# Patient Record
Sex: Female | Born: 2000 | Race: Black or African American | Hispanic: No | Marital: Single | Smoking: Never smoker
Health system: Southern US, Community
[De-identification: ages and names within clinical notes are randomized; demographics above are authoritative.]

## PROBLEM LIST (undated history)

## (undated) DIAGNOSIS — N83209 Unspecified ovarian cyst, unspecified side: Secondary | ICD-10-CM

## (undated) HISTORY — PX: WISDOM TOOTH EXTRACTION: SHX21

## (undated) HISTORY — DX: Unspecified ovarian cyst, unspecified side: N83.209

---
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
## 2019-01-08 ENCOUNTER — Telehealth: Payer: Self-pay | Admitting: Women's Health

## 2019-01-08 NOTE — Telephone Encounter (Signed)
Pt states that she is needing an apt for issues she is having from her nexplanon. She states that she is bleeding, having headaches, and is wanting the nexplanon to be removed.

## 2019-01-16 ENCOUNTER — Telehealth: Payer: Self-pay | Admitting: Advanced Practice Midwife

## 2019-01-16 NOTE — Telephone Encounter (Signed)

## 2019-01-17 ENCOUNTER — Ambulatory Visit (INDEPENDENT_AMBULATORY_CARE_PROVIDER_SITE_OTHER): Payer: Medicaid Other | Admitting: Advanced Practice Midwife

## 2019-01-17 ENCOUNTER — Encounter: Payer: Self-pay | Admitting: Advanced Practice Midwife

## 2019-01-17 ENCOUNTER — Other Ambulatory Visit: Payer: Self-pay

## 2019-01-17 VITALS — BP 101/57 | HR 70 | Ht 64.0 in | Wt 155.0 lb

## 2019-01-17 DIAGNOSIS — Z3046 Encounter for surveillance of implantable subdermal contraceptive: Secondary | ICD-10-CM | POA: Insufficient documentation

## 2019-01-17 MED ORDER — NORETHIN-ETH ESTRAD-FE BIPHAS 1 MG-10 MCG / 10 MCG PO TABS: 1.0000 | ORAL_TABLET | Freq: Every day | ORAL | 11 refills | Status: DC

## 2019-01-17 NOTE — Progress Notes (Signed)
HPI:  Toni Johnston 18 y.o. here for Nexplanon removal. She has had it for 2.5 years and still bleeds every few months for a few months at a time. Has tried Megace.  Her future plans for birth control are COCs.  Past Medical History: No past medical history on file.  Past Surgical History: Past Surgical History:  Procedure Laterality Date  . WISDOM TOOTH EXTRACTION      Family History: Family History  Problem Relation Age of Onset  . Cancer Paternal Grandfather   . Hypertension Maternal Grandmother   . Seizures Maternal Grandmother     Social History: Social History   Tobacco Use  . Smoking status: Never Smoker  . Smokeless tobacco: Never Used  Substance Use Topics  . Alcohol use: No  . Drug use: No    Allergies: No Known Allergies  Meds: (Not in a hospital admission)     Patient given informed consent for removal of her Nexplanon, time out was performed.  Signed copy in the chart.  Appropriate time out taken. Implanon site identified.  Area prepped in usual sterile fashon. One cc of 1% lidocaine was used to anesthetize the area at the distal end of the implant. A small stab incision was made right beside the implant on the distal portion.  The Nexplanon rod was grasped using hemostats and removed without difficulty.  There was less than 3 cc blood loss. There were no complications.  A small amount of antibiotic ointment and steri-strips were applied over the small incision.  A pressure bandage was applied to reduce any bruising.  The patient tolerated the procedure well and was given post procedure instructions.

## 2019-01-17 NOTE — Patient Instructions (Signed)

## 2019-02-07 DIAGNOSIS — H5203 Hypermetropia, bilateral: Secondary | ICD-10-CM | POA: Diagnosis not present

## 2019-02-26 DIAGNOSIS — H5213 Myopia, bilateral: Secondary | ICD-10-CM | POA: Diagnosis not present

## 2019-03-08 DIAGNOSIS — H5203 Hypermetropia, bilateral: Secondary | ICD-10-CM | POA: Diagnosis not present

## 2019-03-08 DIAGNOSIS — H52223 Regular astigmatism, bilateral: Secondary | ICD-10-CM | POA: Diagnosis not present

## 2019-04-01 ENCOUNTER — Telehealth: Payer: Self-pay | Admitting: *Deleted

## 2019-04-01 NOTE — Telephone Encounter (Signed)
Patient states she is wanting to change birth control pills as she is having irregular bleeding with the current ones.

## 2019-04-01 NOTE — Telephone Encounter (Addendum)
Pt is on Lo Loestrin x 2-3 months. Having irregular periods, having bleeding everyday. Pt is wanting to change to a different pill. I advised pt that we encourage pt's to try and stick with the same pill for at least 3 months but she is wanting to change. Thanks!! [place]a

## 2019-04-02 NOTE — Telephone Encounter (Signed)
Pt aware of Kim's recommendations and voiced understanding. Call was transferred to front desk for self swab appt. [place]

## 2019-04-04 ENCOUNTER — Other Ambulatory Visit: Payer: Self-pay

## 2019-04-04 ENCOUNTER — Other Ambulatory Visit (INDEPENDENT_AMBULATORY_CARE_PROVIDER_SITE_OTHER): Payer: Medicaid Other | Admitting: *Deleted

## 2019-04-04 ENCOUNTER — Other Ambulatory Visit (HOSPITAL_COMMUNITY)
Admission: RE | Admit: 2019-04-04 | Discharge: 2019-04-04 | Disposition: A | Discharge status: Home / Self Care | Payer: Medicaid Other | Source: Ambulatory Visit | Attending: Obstetrics & Gynecology | Admitting: Obstetrics & Gynecology

## 2019-04-04 DIAGNOSIS — N939 Abnormal uterine and vaginal bleeding, unspecified: Secondary | ICD-10-CM | POA: Diagnosis not present

## 2019-04-04 NOTE — Progress Notes (Addendum)
   NURSE VISIT- VAGINITIS/STD/POC  SUBJECTIVE:  Toni Johnston is a 18 y.o. No obstetric history on file. GYN patientfemale here for a vaginal swab for STD screen.  She reports the following symptoms: abnormal bleeding: flow is moderate for 2 months. Denies abnormal vaginal bleeding, significant pelvic pain, fever, or UTI symptoms.  OBJECTIVE:  There were no vitals taken for this visit.  Appears well, in no apparent distress  ASSESSMENT: Vaginal swab for STD screen  PLAN: Self-collected vaginal probe for Gonorrhea, Chlamydia, Trichomonas, Bacterial Vaginosis, Yeast sent to lab Treatment: to be determined once results are received Follow-up as needed if symptoms persist/worsen, or new symptoms develop  Rash, Toni Johnston  04/04/2019 9:28 AM   Chart reviewed for nurse visit. Agree with plan of care.  Toni Johnston, [place] 04/04/2019 1:13 PM

## 2019-04-05 LAB — CERVICOVAGINAL ANCILLARY ONLY
Bacterial Vaginitis (gardnerella): NEGATIVE
Candida Glabrata: NEGATIVE
Candida Vaginitis: POSITIVE — AB
Chlamydia: NEGATIVE
Comment: NEGATIVE
Comment: NEGATIVE
Comment: NEGATIVE
Comment: NEGATIVE
Comment: NEGATIVE
Comment: NORMAL
Neisseria Gonorrhea: NEGATIVE
Trichomonas: NEGATIVE

## 2019-04-08 ENCOUNTER — Other Ambulatory Visit: Payer: Self-pay | Admitting: Women's Health

## 2019-04-08 ENCOUNTER — Telehealth: Payer: Self-pay | Admitting: *Deleted

## 2019-04-08 MED ORDER — FLUCONAZOLE 150 MG PO TABS: 150.0000 mg | ORAL_TABLET | Freq: Once | ORAL | 0 refills | Status: AC

## 2019-04-08 NOTE — Telephone Encounter (Signed)
Pt left message that she has a yeast infection. Wants to know what to do about it.

## 2019-07-03 ENCOUNTER — Encounter: Payer: Self-pay | Admitting: Family Medicine

## 2019-07-04 ENCOUNTER — Encounter: Payer: Self-pay | Admitting: Family Medicine

## 2021-01-11 DIAGNOSIS — R109 Unspecified abdominal pain: Secondary | ICD-10-CM | POA: Diagnosis not present

## 2021-01-11 DIAGNOSIS — R519 Headache, unspecified: Secondary | ICD-10-CM | POA: Diagnosis not present

## 2021-01-11 DIAGNOSIS — R197 Diarrhea, unspecified: Secondary | ICD-10-CM | POA: Diagnosis not present

## 2021-01-11 DIAGNOSIS — R112 Nausea with vomiting, unspecified: Secondary | ICD-10-CM | POA: Diagnosis not present

## 2021-01-12 ENCOUNTER — Telehealth: Payer: Self-pay

## 2021-01-12 NOTE — Telephone Encounter (Signed)
Transition Care Management Unsuccessful Follow-up Telephone Call  Date of discharge and from where:  01/11/2021[facility]  Attempts:  1st Attempt  Reason for unsuccessful TCM follow-up call:  Left voice message

## 2021-01-13 NOTE — Telephone Encounter (Signed)
Transition Care Management Unsuccessful Follow-up Telephone Call  Date of discharge and from where:  01/11/2021 from [facility]  Attempts:  2nd Attempt  Reason for unsuccessful TCM follow-up call:  Left voice message

## 2021-01-14 NOTE — Telephone Encounter (Signed)
Transition Care Management Unsuccessful Follow-up Telephone Call  Date of discharge and from where:  01/11/2021-[facility] Toni Johnston  Attempts:  3rd Attempt  Reason for unsuccessful TCM follow-up call:  Left voice message

## 2022-06-12 ENCOUNTER — Emergency Department (HOSPITAL_BASED_OUTPATIENT_CLINIC_OR_DEPARTMENT_OTHER): Payer: BC Managed Care – PPO

## 2022-06-12 ENCOUNTER — Encounter (HOSPITAL_BASED_OUTPATIENT_CLINIC_OR_DEPARTMENT_OTHER): Payer: Self-pay

## 2022-06-12 ENCOUNTER — Other Ambulatory Visit: Payer: Self-pay

## 2022-06-12 ENCOUNTER — Emergency Department (HOSPITAL_BASED_OUTPATIENT_CLINIC_OR_DEPARTMENT_OTHER)
Admission: EM | Admit: 2022-06-12 | Discharge: 2022-06-12 | Disposition: A | Discharge status: Home / Self Care | Payer: BC Managed Care – PPO | Attending: Emergency Medicine | Admitting: Emergency Medicine

## 2022-06-12 DIAGNOSIS — N938 Other specified abnormal uterine and vaginal bleeding: Secondary | ICD-10-CM | POA: Diagnosis not present

## 2022-06-12 DIAGNOSIS — R1030 Lower abdominal pain, unspecified: Secondary | ICD-10-CM | POA: Insufficient documentation

## 2022-06-12 DIAGNOSIS — N939 Abnormal uterine and vaginal bleeding, unspecified: Secondary | ICD-10-CM

## 2022-06-12 LAB — WET PREP, GENITAL
Clue Cells Wet Prep HPF POC: NONE SEEN
Sperm: NONE SEEN
Trich, Wet Prep: NONE SEEN
WBC, Wet Prep HPF POC: >=10 — AB (ref <10)
Yeast Wet Prep HPF POC: NONE SEEN

## 2022-06-12 LAB — URINALYSIS, ROUTINE W REFLEX MICROSCOPIC
Bacteria, UA: NONE SEEN
Bilirubin Urine: NEGATIVE
Glucose, UA: NEGATIVE mg/dL
Ketones, ur: NEGATIVE mg/dL
Leukocytes,Ua: NEGATIVE
Nitrite: NEGATIVE
Protein, ur: NEGATIVE mg/dL
RBC / HPF: >50 RBC/hpf — ABNORMAL HIGH (ref 0–5)
Specific Gravity, Urine: 1.016 (ref 1.005–1.030)
pH: 6.5 (ref 5.0–8.0)

## 2022-06-12 LAB — PREGNANCY, URINE: Preg Test, Ur: NEGATIVE

## 2022-06-12 NOTE — ED Provider Notes (Signed)
[facility] EMERGENCY DEPT Provider Note   CSN: [id number] Arrival date & time: 06/12/22  1256     History {Add pertinent medical, surgical, social history, OB history to HPI:1} Chief Complaint  Patient presents with   Vaginal Bleeding    Toni Johnston is a 22 y.o. female with no significant past medical history presenting to the emergency department for evaluation of vaginal bleeding x 2 days.  Patient reports that she has been soaking through 4 pads in the last 2 days.  She reports bright red blood with no clots.  She reports unprotected intercourse on January 1 when she took Plan B right after.  Patient reports her period ended on 12/29 which lasted for 7 days as her usual menstrual cycle.  No fever, nausea, vomiting.  Patient reports intermittent lower abdominal cramping which she took Tylenol and ibuprofen for.   Vaginal Bleeding   History reviewed. No pertinent past medical history. Past Surgical History:  Procedure Laterality Date   WISDOM TOOTH EXTRACTION       Home Medications Prior to Admission medications   Medication Sig Start Date End Date Taking? Authorizing Provider  megestrol (MEGACE) 40 MG tablet Take 3 tabs po qd x 5 d then 2 qd x 5 d then one qd 04/19/17   Nilda Simmer, NP  metroNIDAZOLE (FLAGYL) 500 MG tablet One p o tid for 7 days Patient not taking: Reported on 07/23/2018 01/19/18   Mikey Kirschner, MD  Norethindrone-Ethinyl Estradiol-Fe Biphas (LO LOESTRIN FE) 1 MG-10 MCG / 10 MCG tablet Take 1 tablet by mouth daily. 01/17/19   Cresenzo-Dishmon, Joaquim Lai, CNM  triamcinolone cream (KENALOG) 0.1 % Apply 1 application topically 2 (two) times daily. Patient not taking: Reported on 01/17/2019 09/19/16   Mikey Kirschner, MD      Allergies    Patient has no known allergies.    Review of Systems   Review of Systems  Genitourinary:  Positive for vaginal bleeding.    Physical Exam Updated Vital Signs BP 120/68 (BP Location: Right Arm)    Pulse 73   Temp 97.9 F (36.6 C) (Oral)   Resp 18   Ht 5\' 4"  (1.626 m)   Wt 59 kg   LMP 05/28/2022 (Exact Date)   SpO2 100%   BMI 22.31 kg/m  Physical Exam Vitals and nursing note reviewed.  Constitutional:      Appearance: Normal appearance.  HENT:     Head: Normocephalic and atraumatic.     Mouth/Throat:     Mouth: Mucous membranes are moist.  Eyes:     General: No scleral icterus. Cardiovascular:     Rate and Rhythm: Normal rate and regular rhythm.     Pulses: Normal pulses.     Heart sounds: Normal heart sounds.  Pulmonary:     Effort: Pulmonary effort is normal.     Breath sounds: Normal breath sounds.  Abdominal:     General: Abdomen is flat.     Palpations: Abdomen is soft.     Tenderness: There is abdominal tenderness (suprapubic).  Musculoskeletal:        General: No deformity.  Skin:    General: Skin is warm.     Findings: No rash.  Neurological:     General: No focal deficit present.     Mental Status: She is alert.  Psychiatric:        Mood and Affect: Mood normal.     ED Results / Procedures / Treatments   Labs (  all labs ordered are listed, but only abnormal results are displayed) Labs Reviewed  WET PREP, GENITAL - Abnormal; Notable for the following components:      Result Value   WBC, Wet Prep HPF POC >=10 (*)    All other components within normal limits  URINALYSIS, ROUTINE W REFLEX MICROSCOPIC - Abnormal; Notable for the following components:   APPearance HAZY (*)    Hgb urine dipstick LARGE (*)    RBC / HPF >50 (*)    All other components within normal limits  PREGNANCY, URINE  GC/CHLAMYDIA PROBE AMP ([place]) NOT AT [facility]    EKG None  Radiology US PELVIC COMPLETE W TRANSVAGINAL AND TORSION R/O  Result Date: 06/12/2022 CLINICAL DATA:  Vaginal bleeding. EXAM: TRANSABDOMINAL AND TRANSVAGINAL ULTRASOUND OF PELVIS DOPPLER ULTRASOUND OF OVARIES TECHNIQUE: Both transabdominal and transvaginal ultrasound examinations of the pelvis  were performed. Transabdominal technique was performed for global imaging of the pelvis including uterus, ovaries, adnexal regions, and pelvic cul-de-sac. It was necessary to proceed with endovaginal exam following the transabdominal exam to visualize the ovaries and endometrium. Color and duplex Doppler ultrasound was utilized to evaluate blood flow to the ovaries. COMPARISON:  None Available. FINDINGS: Uterus Measurements: 7.8 x 3.7 x 4.6 cm = volume: 70 mL. No fibroids or other mass visualized. Endometrium Thickness: 8 mm.  No focal abnormality visualized. Right ovary Measurements: 4.4 x 2.7 x 2.7 cm = volume: 16.2 mL. Multiple follicles are present. There is a single homogeneous mildly hypoechoic area in the right ovary measuring 1.0 x 1.0 x 1.2 cm. There is some diffuse low-level echoes in this region. Left ovary Measurements: 3.5 x 3.0 x 2.6 cm = volume: 14 mL. Anechoic cyst with thin septation present measuring 2.2 x 1.8 x 1.6 cm. Pulsed Doppler evaluation of both ovaries demonstrates normal low-resistance arterial and venous waveforms. Other findings There is trace free fluid in the cul-de-sac. There are 2 small anechoic cystic structure seen in the cul-de-sac measuring up to 1.7 cm, indeterminate. IMPRESSION: 1. Normal appearance of the uterus and endometrium. 2. Single mildly hypoechoic area in the right ovary measuring up to 1.2 cm. This may represent a small hemorrhagic cyst or endometrioma. Follow-up ultrasound in 6-12 weeks is recommended. 3. Septated cyst in the left ovary measuring up to 2.2 cm. 4. Two small anechoic cystic structures in the cul-de-sac measuring up to 1.7 cm, indeterminate. These may represent small paraovarian cysts. Attention on follow-up. 5. Trace free fluid in the cul-de-sac, likely physiologic. Electronically Signed   By: Darliss Cheney M.D.   On: 06/12/2022 17:45    Procedures Procedures  {Document cardiac monitor, telemetry assessment procedure when  appropriate:1}  Medications Ordered in ED Medications - No data to display  ED Course/ Medical Decision Making/ A&P                           Medical Decision Making Amount and/or Complexity of Data Reviewed Labs: ordered.   This patient presents to the ED for vaginal bleeding, this involves an extensive number of treatment options, and is a complaint that carries with a high risk of complications and morbidity.  The differential diagnosis includes polyps, adenomyosis, leiomyoma, malignancy, coagulopathy, endometrial, infectious etiology.  This is not an exhaustive list.  Lab tests: I ordered and personally interpreted labs.  The pertinent results include: WBC unremarkable. Hbg unremarkable. Platelets unremarkable. No electrolyte abnormalities noted.  BUN, creatinine unremarkable. UA significant for +Hgb  Imaging  studies: I ordered imaging studies. I personally reviewed, interpreted imaging and agree with the radiologist's interpretations. The results include: 1. Normal appearance of the uterus and endometrium. 2. Single mildly hypoechoic area in the right ovary measuring up to 1.2 cm. This may represent a small hemorrhagic cyst or endometrioma. Follow-up ultrasound in 6-12 weeks is recommended. 3. Septated cyst in the left ovary measuring up to 2.2 cm. 4. Two small anechoic cystic structures in the cul-de-sac measuring up to 1.7 cm, indeterminate. These may represent small paraovarian cysts. Attention on follow-up. 5. Trace free fluid in the cul-de-sac, likely physiologic.     Problem list/ ED course/ Critical interventions/ Medical management: HPI: See above Vital signs within normal range and stable throughout visit. Laboratory/imaging studies significant for: See above. On physical examination, patient is afebrile and appears in no acute distress.  Based on patient's clinical presentations and laboratory/imaging studies I suspect bleeding secondary to hemorrhagic cysts or  endometrioma.  Patient is hemodynamically stable at the moment.  Advised patient to take Tylenol/ibuprofen for pain.  Follow-up with OB/GYN for further evaluation management.  Return to the ER if new or worsening symptoms.. I have reviewed the patient home medicines and have made adjustments as needed.  Cardiac monitoring/EKG: The patient was maintained on a cardiac monitor.  I personally reviewed and interpreted the cardiac monitor which showed an underlying rhythm of: sinus rhythm.  Additional history obtained: External records from outside source obtained and reviewed including: Chart review including previous notes, labs, imaging.  Consultations obtained: I requested consultation with Dr. Debroah Loop through secure chat, and discussed lab and imaging findings as well as pertinent plan.  He recommended outpatient follow-up with OB/GYN.  Disposition Continued outpatient therapy. Follow-up with OBGYN recommended for reevaluation of symptoms. Treatment plan discussed with patient.  Pt acknowledged understanding was agreeable to the plan. Worrisome signs and symptoms were discussed with patient, and patient acknowledged understanding to return to the ED if they noticed these signs and symptoms. Patient was stable upon discharge.   This chart was dictated using voice recognition software.  Despite best efforts to proofread,  errors can occur which can change the documentation meaning.    {Document critical care time when appropriate:1} {Document review of labs and clinical decision tools ie heart score, Chads2Vasc2 etc:1}  {Document your independent review of radiology images, and any outside records:1} {Document your discussion with family members, caretakers, and with consultants:1} {Document social determinants of health affecting pt's care:1} {Document your decision making why or why not admission, treatments were needed:1} Final Clinical Impression(s) / ED Diagnoses Final diagnoses:  Vaginal  bleeding    Rx / DC Orders ED Discharge Orders     None

## 2022-06-12 NOTE — ED Notes (Signed)
Pt alert, NAD, calm, interactive, dressed, sitting in chair. Results pending.

## 2022-06-12 NOTE — ED Triage Notes (Signed)
Pt arrives POV from home with vaginal bleeding that started on 06/11/22.  Says her menstrual cycle ended on 06/03/22, she had unprotected intercourse on 06/06/22, took Plan B following this  Having abdominal cramping along with bleeding.  Denies any urinary symptoms.  Denies vaginal discharge.  Denies fevers.

## 2022-06-12 NOTE — Discharge Instructions (Addendum)
Your ultrasound showed cysts in the right ovary and left ovary that may be the cause of the bleeding.  I recommend close follow-up with OB GYN for reevaluation.  Please do not hesitate to return to emergency department if worrisome signs symptoms we discussed become apparent.

## 2022-06-13 LAB — GC/CHLAMYDIA PROBE AMP (~~LOC~~) NOT AT ARMC
Chlamydia: NEGATIVE
Comment: NEGATIVE
Comment: NORMAL
Neisseria Gonorrhea: NEGATIVE

## 2022-06-14 ENCOUNTER — Telehealth: Payer: Self-pay | Admitting: *Deleted

## 2022-06-14 NOTE — Patient Outreach (Signed)
  Care Coordination [facility] Note Transition Care Management Unsuccessful Follow-up Telephone Call  Date of discharge and from where:  06/12/22 from [facility]  Attempts:  1st Attempt  Reason for unsuccessful TCM follow-up call:  Left voice message   Lurena Joiner RN, BSN [place] RN Care Coordinator

## 2022-06-21 ENCOUNTER — Encounter: Payer: Self-pay | Admitting: Women's Health

## 2022-06-21 ENCOUNTER — Other Ambulatory Visit (HOSPITAL_COMMUNITY)
Admission: RE | Admit: 2022-06-21 | Discharge: 2022-06-21 | Disposition: A | Discharge status: Home / Self Care | Payer: BC Managed Care – PPO | Source: Ambulatory Visit | Attending: Women's Health | Admitting: Women's Health

## 2022-06-21 ENCOUNTER — Ambulatory Visit (INDEPENDENT_AMBULATORY_CARE_PROVIDER_SITE_OTHER): Payer: BC Managed Care – PPO | Admitting: Women's Health

## 2022-06-21 VITALS — BP 109/66 | HR 81 | Ht 64.0 in | Wt 147.5 lb

## 2022-06-21 DIAGNOSIS — N83202 Unspecified ovarian cyst, left side: Secondary | ICD-10-CM | POA: Diagnosis not present

## 2022-06-21 DIAGNOSIS — Z3202 Encounter for pregnancy test, result negative: Secondary | ICD-10-CM | POA: Diagnosis not present

## 2022-06-21 DIAGNOSIS — Z124 Encounter for screening for malignant neoplasm of cervix: Secondary | ICD-10-CM

## 2022-06-21 DIAGNOSIS — Z30011 Encounter for initial prescription of contraceptive pills: Secondary | ICD-10-CM | POA: Diagnosis not present

## 2022-06-21 DIAGNOSIS — N83201 Unspecified ovarian cyst, right side: Secondary | ICD-10-CM | POA: Diagnosis not present

## 2022-06-21 LAB — POCT URINE PREGNANCY: Preg Test, Ur: NEGATIVE

## 2022-06-21 MED ORDER — NEXTSTELLIS 3-14.2 MG PO TABS: 1.0000 | ORAL_TABLET | Freq: Every day | ORAL | 3 refills | Status: DC

## 2022-06-21 NOTE — Progress Notes (Signed)
GYN VISIT Patient name: Toni Johnston MRN [id number]  Date of birth: [birth year] Chief Complaint:   Ovarian Cyst (Follow up from ER on 1/7)  History of Present Illness:   Toni Johnston is a 22 y.o. G0P0000 African-American female being seen today for f/u after ED visit 06/12/22. Went for 2d of heavy vaginal bleeding. Had unprotected sex 1/1, took PlanB right after. UPT neg 1/7, bleeding has stopped. No pain. GC/CT were both neg. Incidentally, 2 ovarian cysts were found on pelvic u/s (results below). Does want to get on birth control. Had Nexplanon in past, remove d/t bleeding issues. Was on LoLoestrin, but didn't like it b/c felt like made cramps worse. Discussed all options, wants to try a different pill. Does not smoke, no h/o HTN, DVT/PE, CVA, MI, or migraines w/ aura.      Patient's last menstrual period was 05/28/2022 (exact date). Last pap never. Results were: N/A  Pelvic u/s from ED visit 06/12/22 FINDINGS:  Uterus Measurements: 7.8 x 3.7 x 4.6 cm = volume: 70 mL. No fibroids or other mass visualized.   Endometrium Thickness: 8 mm.  No focal abnormality visualized.   Right ovary Measurements: 4.4 x 2.7 x 2.7 cm = volume: 16.2 mL. Multiple follicles are present. There is a single homogeneous mildly hypoechoic area in the right ovary measuring 1.0 x 1.0 x 1.2 cm. There is some diffuse low-level echoes in this region.   Left ovary Measurements: 3.5 x 3.0 x 2.6 cm = volume: 14 mL. Anechoic cyst with thin septation present measuring 2.2 x 1.8 x 1.6 cm.   Pulsed Doppler evaluation of both ovaries demonstrates normal low-resistance arterial and venous waveforms.   Other findings   There is trace free fluid in the cul-de-sac. There are 2 small anechoic cystic structure seen in the cul-de-sac measuring up to 1.7 cm, indeterminate.   IMPRESSION: 1. Normal appearance of the uterus and endometrium. 2. Single mildly hypoechoic area in the right ovary measuring up to 1.2 cm.  This may represent a small hemorrhagic cyst or endometrioma. Follow-up ultrasound in 6-12 weeks is recommended. 3. Septated cyst in the left ovary measuring up to 2.2 cm. 4. Two small anechoic cystic structures in the cul-de-sac measuring up to 1.7 cm, indeterminate. These may represent small paraovarian cysts. Attention on follow-up. 5. Trace free fluid in the cul-de-sac, likely physiologic.   Electronically Signed   By: Ronney Asters M.D.   On: 06/12/2022 17:45     07/23/2018   10:40 AM  Depression screen PHQ 2/9  Decreased Interest 2  Down, Depressed, Hopeless 1  PHQ - 2 Score 3  Altered sleeping 3  Tired, decreased energy 3  Change in appetite 3  Feeling bad or failure about yourself  2  Trouble concentrating 1  Moving slowly or fidgety/restless 0  Suicidal thoughts 0  PHQ-9 Score 15  Difficult doing work/chores Not difficult at all        07/23/2018   10:38 AM  GAD 7 : Generalized Anxiety Score  Nervous, Anxious, on Edge 2  Control/stop worrying 1  Worry too much - different things 3  Trouble relaxing 1  Restless 1  Easily annoyed or irritable 3  Afraid - awful might happen 0  Total GAD 7 Score 11  Anxiety Difficulty Not difficult at all     Review of Systems:   Pertinent items are noted in HPI Denies fever/chills, dizziness, headaches, visual disturbances, fatigue, shortness of breath, chest pain, abdominal pain, vomiting,  abnormal vaginal discharge/itching/odor/irritation, problems with periods, bowel movements, urination, or intercourse unless otherwise stated above.  Pertinent History Reviewed:  Reviewed past medical,surgical, social, obstetrical and family history.  Reviewed problem list, medications and allergies. Physical Assessment:   Vitals:   06/21/22 1417  BP: 109/66  Pulse: 81  Weight: 147 lb 8 oz (66.9 kg)  Height: 5\' 4"  (1.626 m)  Body mass index is 25.32 kg/m.       Physical Examination:   General appearance: alert, well appearing, and  in no distress  Mental status: alert, oriented to person, place, and time  Skin: warm & dry   Cardiovascular: normal heart rate noted  Respiratory: normal respiratory effort, no distress  Abdomen: soft, non-tender   Pelvic: VULVA: normal appearing vulva with no masses, tenderness or lesions, VAGINA: normal appearing vagina with normal color and discharge, no lesions, CERVIX: normal appearing cervix without discharge or lesions, UTERUS: uterus is normal size, shape, consistency and nontender, ADNEXA: normal adnexa in size, nontender and no masses  Extremities: no edema   Chaperone: Levy Pupa    Results for orders placed or performed in visit on 06/21/22 (from the past 24 hour(s))  POCT urine pregnancy   Collection Time: 06/21/22  2:42 PM  Result Value Ref Range   Preg Test, Ur Negative Negative    Assessment & Plan:  1) 2 ovarian cysts> u/s reviewed by Dr. Elonda Husky, no f/u needed. Pt asymptomatic. If anything changes, let us know  2) Cervical cancer screening> pap today (had neg gc/ct 06/12/22)  3) Contraception management> rx Nexstellis to [facility], if any issues getting it let us know. Condoms x 2wks and always for STD prevention. F/U 28mths in office  Meds:  Meds ordered this encounter  Medications   Drospirenone-Estetrol (NEXTSTELLIS) 3-14.2 MG TABS    Sig: Take 1 tablet by mouth daily.    Dispense:  90 tablet    Refill:  3    Orders Placed This Encounter  Procedures   POCT urine pregnancy    Return in about 3 months (around 09/20/2022) for med f/u, CNM, in person.  [place], [facility] 06/21/2022 2:46 PM

## 2022-06-23 LAB — CYTOLOGY - PAP: Diagnosis: NEGATIVE

## 2022-09-16 ENCOUNTER — Encounter: Payer: Self-pay | Admitting: Women's Health

## 2022-09-20 ENCOUNTER — Encounter: Payer: Self-pay | Admitting: Women's Health

## 2022-09-20 ENCOUNTER — Other Ambulatory Visit (HOSPITAL_COMMUNITY)
Admission: RE | Admit: 2022-09-20 | Discharge: 2022-09-20 | Disposition: A | Discharge status: Home / Self Care | Payer: Medicaid Other | Source: Ambulatory Visit | Attending: Women's Health | Admitting: Women's Health

## 2022-09-20 ENCOUNTER — Ambulatory Visit: Payer: Medicaid Other | Admitting: Women's Health

## 2022-09-20 VITALS — BP 113/65 | HR 63 | Ht 64.0 in | Wt 152.6 lb

## 2022-09-20 DIAGNOSIS — Z3041 Encounter for surveillance of contraceptive pills: Secondary | ICD-10-CM | POA: Diagnosis not present

## 2022-09-20 DIAGNOSIS — N898 Other specified noninflammatory disorders of vagina: Secondary | ICD-10-CM | POA: Insufficient documentation

## 2022-09-20 NOTE — Progress Notes (Signed)
   GYN VISIT Patient name: Toni Johnston MRN [id number]  Date of birth: 2001/01/31 Chief Complaint:   Follow-up  History of Present Illness:   MAROLYN Johnston is a 22 y.o. G0P0000 African-American female being seen today for f/u on Nextstellis rx'd 06/21/22, doing well but forgets pills sometimes. Periods a little longer, but lighter. Hasn't gotten rx for coc's yet d/t insurance, would like more samples if possible. Thinking she may want to try Nexplanon, but not ready today- wants to think about it some more. Some vaginal discharge, no current itching/irritation/odor, did have some over weekend. .    No LMP recorded. The current method of family planning is OCP (estrogen/progesterone).  Last pap 06/21/22. Results were: NILM w/ HRHPV not done     07/23/2018   10:40 AM  Depression screen PHQ 2/9  Decreased Interest 2  Down, Depressed, Hopeless 1  PHQ - 2 Score 3  Altered sleeping 3  Tired, decreased energy 3  Change in appetite 3  Feeling bad or failure about yourself  2  Trouble concentrating 1  Moving slowly or fidgety/restless 0  Suicidal thoughts 0  PHQ-9 Score 15  Difficult doing work/chores Not difficult at all        07/23/2018   10:38 AM  GAD 7 : Generalized Anxiety Score  Nervous, Anxious, on Edge 2  Control/stop worrying 1  Worry too much - different things 3  Trouble relaxing 1  Restless 1  Easily annoyed or irritable 3  Afraid - awful might happen 0  Total GAD 7 Score 11  Anxiety Difficulty Not difficult at all     Review of Systems:   Pertinent items are noted in HPI Denies fever/chills, dizziness, headaches, visual disturbances, fatigue, shortness of breath, chest pain, abdominal pain, vomiting, abnormal vaginal discharge/itching/odor/irritation, problems with periods, bowel movements, urination, or intercourse unless otherwise stated above.  Pertinent History Reviewed:  Reviewed past medical,surgical, social, obstetrical and family history.  Reviewed  problem list, medications and allergies. Physical Assessment:   Vitals:   09/20/22 0836  BP: 113/65  Pulse: 63  Weight: 152 lb 9.6 oz (69.2 kg)  Height:  (1.626 m)  Body mass index is 26.19 kg/m.       Physical Examination:   General appearance: alert, well appearing, and in no distress  Mental status: alert, oriented to person, place, and time  Skin: warm & dry   Cardiovascular: normal heart rate noted  Respiratory: normal respiratory effort, no distress  Abdomen: soft, non-tender   Pelvic: examination not indicated, self-collected CV swab  Extremities: no edema   Chaperone: N/A    No results found for this or any previous visit (from the past 24 hour(s)).  Assessment & Plan:  1) Contraception surveillance> overall doing well on Nextstellis, set alarm to help remind to take pills. 3 samples Nextstellis given today. Let us know when ready for Nexplanon  2) Vaginal d/c> self-collected CV swab sent  Meds: No orders of the defined types were placed in this encounter.   No orders of the defined types were placed in this encounter.   Return in about 1 year (around 09/20/2023) for Physical.  Cheral Marker CNM, [facility] 09/20/2022 9:07 AM

## 2022-09-21 LAB — CERVICOVAGINAL ANCILLARY ONLY
Bacterial Vaginitis (gardnerella): NEGATIVE
Candida Glabrata: NEGATIVE
Candida Vaginitis: POSITIVE — AB
Chlamydia: NEGATIVE
Comment: NEGATIVE
Comment: NEGATIVE
Comment: NEGATIVE
Comment: NEGATIVE
Comment: NEGATIVE
Comment: NORMAL
Neisseria Gonorrhea: NEGATIVE
Trichomonas: NEGATIVE

## 2022-09-22 ENCOUNTER — Other Ambulatory Visit: Payer: Self-pay | Admitting: Adult Health

## 2022-09-22 MED ORDER — FLUCONAZOLE 150 MG PO TABS: ORAL_TABLET | ORAL | 1 refills | Status: DC

## 2022-09-22 NOTE — Progress Notes (Signed)
+  yeast on vaginal swab will rx diflucan  

## 2023-03-28 ENCOUNTER — Encounter: Payer: Self-pay | Admitting: Women's Health

## 2023-03-28 ENCOUNTER — Ambulatory Visit: Payer: Medicaid Other | Admitting: Women's Health

## 2023-03-28 ENCOUNTER — Other Ambulatory Visit (HOSPITAL_COMMUNITY)
Admission: RE | Admit: 2023-03-28 | Discharge: 2023-03-28 | Disposition: A | Discharge status: Home / Self Care | Payer: Medicaid Other | Source: Ambulatory Visit | Attending: Women's Health | Admitting: Women's Health

## 2023-03-28 VITALS — BP 114/65 | HR 70 | Ht 64.0 in | Wt 148.6 lb

## 2023-03-28 DIAGNOSIS — R300 Dysuria: Secondary | ICD-10-CM | POA: Diagnosis not present

## 2023-03-28 DIAGNOSIS — N898 Other specified noninflammatory disorders of vagina: Secondary | ICD-10-CM | POA: Insufficient documentation

## 2023-03-28 LAB — POCT URINALYSIS DIPSTICK OB
Glucose, UA: NEGATIVE
Ketones, UA: NEGATIVE
Nitrite, UA: POSITIVE

## 2023-03-28 NOTE — Progress Notes (Signed)
GYN VISIT Patient name: Toni Johnston MRN [id number]  Date of birth: 2000/10/27 Chief Complaint:   Follow-up (Wants to discuss ph balance. )  History of Present Illness:   Toni Johnston is a 22 y.o. G0P0000 African-American female being seen today for report of discomfort w/ urination that started last week, improved some now. No increased frequency. Some itching last week, none now. No abnormal vaginal d/c or odor.    Patient's last menstrual period was 03/26/2023. The current method of family planning is condoms, declines Phexxi Last pap 06/21/22. Results were: NILM w/ HRHPV not done     07/23/2018   10:40 AM  Depression screen PHQ 2/9  Decreased Interest 2  Down, Depressed, Hopeless 1  PHQ - 2 Score 3  Altered sleeping 3  Tired, decreased energy 3  Change in appetite 3  Feeling bad or failure about yourself  2  Trouble concentrating 1  Moving slowly or fidgety/restless 0  Suicidal thoughts 0  PHQ-9 Score 15  Difficult doing work/chores Not difficult at all        07/23/2018   10:38 AM  GAD 7 : Generalized Anxiety Score  Nervous, Anxious, on Edge 2  Control/stop worrying 1  Worry too much - different things 3  Trouble relaxing 1  Restless 1  Easily annoyed or irritable 3  Afraid - awful might happen 0  Total GAD 7 Score 11  Anxiety Difficulty Not difficult at all     Review of Systems:   Pertinent items are noted in HPI Denies fever/chills, dizziness, headaches, visual disturbances, fatigue, shortness of breath, chest pain, abdominal pain, vomiting, abnormal vaginal discharge/itching/odor/irritation, problems with periods, bowel movements, urination, or intercourse unless otherwise stated above.  Pertinent History Reviewed:  Reviewed past medical,surgical, social, obstetrical and family history.  Reviewed problem list, medications and allergies. Physical Assessment:   Vitals:   03/28/23 0908  BP: 114/65  Pulse: 70  Weight: 148 lb 9.6 oz (67.4 kg)   Height: 5\' 4"  (1.626 m)  Body mass index is 25.51 kg/m.       Physical Examination:   General appearance: alert, well appearing, and in no distress  Mental status: alert, oriented to person, place, and time  Skin: warm & dry   Cardiovascular: normal heart rate noted  Respiratory: normal respiratory effort, no distress  Abdomen: soft, non-tender   Pelvic: examination not indicated, self-collected CV swab  Extremities: no edema   Chaperone: N/A    Results for orders placed or performed in visit on 03/28/23 (from the past 24 hour(s))  POC Urinalysis Dipstick OB   Collection Time: 03/28/23  9:22 AM  Result Value Ref Range   Color, UA     Clarity, UA     Glucose, UA Negative Negative   Bilirubin, UA     Ketones, UA neg    Spec Grav, UA     Blood, UA large    pH, UA     POC,PROTEIN,UA Moderate (2+) Negative, Trace, Small (1+), Moderate (2+), Large (3+), 4+   Urobilinogen, UA     Nitrite, UA positive    Leukocytes, UA Trace (A) Negative   Appearance     Odor      Assessment & Plan:  1) Dysuria> last week, improved now, has +nitrites. +blood/protein, is on period. Will send cx  2) Vaginal itching last week> CV swab  Meds: No orders of the defined types were placed in this encounter.   Orders Placed This Encounter  Procedures   Urine Culture   POC Urinalysis Dipstick OB    Return for after 1/16 for , Physical.  Cheral Marker CNM, [facility] 03/28/2023 9:42 AM

## 2023-03-29 LAB — CERVICOVAGINAL ANCILLARY ONLY
Bacterial Vaginitis (gardnerella): POSITIVE — AB
Candida Glabrata: NEGATIVE
Candida Vaginitis: NEGATIVE
Chlamydia: NEGATIVE
Comment: NEGATIVE
Comment: NEGATIVE
Comment: NEGATIVE
Comment: NEGATIVE
Comment: NEGATIVE
Comment: NORMAL
Neisseria Gonorrhea: NEGATIVE
Trichomonas: NEGATIVE

## 2023-03-29 MED ORDER — METRONIDAZOLE 500 MG PO TABS: 500.0000 mg | ORAL_TABLET | Freq: Two times a day (BID) | ORAL | 0 refills | Status: DC

## 2023-03-29 NOTE — Addendum Note (Signed)
Addended by: Cheral Marker on: 03/29/2023 03:24 PM   Modules accepted: Orders

## 2023-03-30 ENCOUNTER — Encounter: Payer: Self-pay | Admitting: Women's Health

## 2023-03-30 MED ORDER — AMOXICILLIN 500 MG PO CAPS: 500.0000 mg | ORAL_CAPSULE | Freq: Two times a day (BID) | ORAL | 0 refills | Status: DC

## 2023-03-30 NOTE — Addendum Note (Signed)
Addended by: Cheral Marker on: 03/30/2023 09:46 AM   Modules accepted: Orders

## 2023-03-31 LAB — URINE CULTURE

## 2023-11-30 ENCOUNTER — Ambulatory Visit

## 2024-01-26 ENCOUNTER — Ambulatory Visit
Admission: RE | Admit: 2024-01-26 | Discharge: 2024-01-26 | Disposition: A | Discharge status: Home / Self Care | Source: Ambulatory Visit | Attending: Family Medicine | Admitting: Family Medicine

## 2024-01-26 VITALS — BP 100/68 | HR 67 | Temp 98.5°F | Resp 16

## 2024-01-26 DIAGNOSIS — R197 Diarrhea, unspecified: Secondary | ICD-10-CM | POA: Diagnosis present

## 2024-01-26 NOTE — ED Provider Notes (Signed)
 [facility]    CSN: [id number] Arrival date & time: 01/26/24  0856      History   Chief Complaint No chief complaint on file.   HPI Toni Johnston is a 23 y.o. female.   Presenting today with about 3 weeks of at least 1 bout of watery diarrhea daily, some stool incontinence off and on including when urinating, slight lower abdominal cramping.  Denies fever, chills, nausea, vomiting, severe abdominal pain, upper respiratory symptoms, new foods or medications, recent travel outside the country, known chronic GI issues.  Trying Pepto with minimal relief.    History reviewed. No pertinent past medical history.  Patient Active Problem List   Diagnosis Date Noted   Acne 06/09/2013    Past Surgical History:  Procedure Laterality Date   WISDOM TOOTH EXTRACTION      OB History     Gravida  0   Para  0   Term  0   Preterm  0   AB  0   Living  0      SAB  0   IAB  0   Ectopic  0   Multiple  0   Live Births  0            Home Medications    Prior to Admission medications   Medication Sig Start Date End Date Taking? Authorizing Provider  amoxicillin  (AMOXIL ) 500 MG capsule Take 1 capsule (500 mg total) by mouth 2 (two) times daily. X 7 days 03/30/23   Kizzie Suzen SAUNDERS, CNM  Drospirenone-Estetrol (NEXTSTELLIS ) 3-14.2 MG TABS Take 1 tablet by mouth daily. Patient not taking: Reported on 03/28/2023 06/21/22   Kizzie Suzen SAUNDERS, CNM  fluconazole  (DIFLUCAN ) 150 MG tablet Take 1 now and 1 in 3 days Patient not taking: Reported on 03/28/2023 09/22/22   Signa Delon LABOR, NP  metroNIDAZOLE  (FLAGYL ) 500 MG tablet Take 1 tablet (500 mg total) by mouth 2 (two) times daily. 03/29/23   Kizzie Suzen SAUNDERS, CNM    Family History Family History  Problem Relation Age of Onset   Cancer Paternal Grandfather    Hypertension Maternal Grandmother    Seizures Maternal Grandmother     Social History Social History   Tobacco Use   Smoking status:  Never   Smokeless tobacco: Never  Vaping Use   Vaping status: Never Used  Substance Use Topics   Alcohol use: Yes    Comment: occasionally   Drug use: No     Allergies   Patient has no known allergies.   Review of Systems Review of Systems PER HPI  Physical Exam Triage Vital Signs ED Triage Vitals [01/26/24 0918]  Encounter Vitals Group     BP 100/68     Girls Systolic BP Percentile      Girls Diastolic BP Percentile      Boys Systolic BP Percentile      Boys Diastolic BP Percentile      Pulse Rate 67     Resp 16     Temp 98.5 F (36.9 C)     Temp Source Oral     SpO2 98 %     Weight      Height      Head Circumference      Peak Flow      Pain Score 0     Pain Loc      Pain Education      Exclude from Growth Chart    No  data found.  Updated Vital Signs BP 100/68   Pulse 67   Temp 98.5 F (36.9 C) (Oral)   Resp 16   LMP 01/24/2024 Comment: end date  SpO2 98%   Visual Acuity Right Eye Distance:   Left Eye Distance:   Bilateral Distance:    Right Eye Near:   Left Eye Near:    Bilateral Near:     Physical Exam Vitals and nursing note reviewed.  Constitutional:      Appearance: Normal appearance. She is not ill-appearing.  HENT:     Head: Atraumatic.     Mouth/Throat:     Mouth: Mucous membranes are moist.  Eyes:     Extraocular Movements: Extraocular movements intact.     Conjunctiva/sclera: Conjunctivae normal.  Cardiovascular:     Rate and Rhythm: Normal rate.  Pulmonary:     Effort: Pulmonary effort is normal.  Abdominal:     General: Bowel sounds are normal. There is no distension.     Palpations: Abdomen is soft.     Tenderness: There is no abdominal tenderness. There is no right CVA tenderness, left CVA tenderness or guarding.  Musculoskeletal:        General: Normal range of motion.     Cervical back: Normal range of motion and neck supple.  Skin:    General: Skin is warm and dry.  Neurological:     Mental Status: She is alert  and oriented to person, place, and time.  Psychiatric:        Mood and Affect: Mood normal.        Thought Content: Thought content normal.        Judgment: Judgment normal.      UC Treatments / Results  Labs (all labs ordered are listed, but only abnormal results are displayed) Labs Reviewed  GASTROINTESTINAL PANEL BY PCR, STOOL (REPLACES STOOL CULTURE)  LIPASE  BASIC METABOLIC PANEL WITH GFR  CBC WITH DIFFERENTIAL/PLATELET    EKG   Radiology No results found.  Procedures Procedures (including critical care time)  Medications Ordered in UC Medications - No data to display  Initial Impression / Assessment and Plan / UC Course  I have reviewed the triage vital signs and the nursing notes.  Pertinent labs & imaging results that were available during my care of the patient were reviewed by me and considered in my medical decision making (see chart for details).     Vitals and exam reassuring today with no red flag findings.  Given the duration of symptoms we will send out GI panel and labs including BMP, CBC, lipase for further evaluation.  Discussed Imodium as needed, BRAT diet, fluids.  Adjust if needed based on results.  Final Clinical Impressions(s) / UC Diagnoses   Final diagnoses:  Diarrhea, unspecified type   Discharge Instructions   None    ED Prescriptions   None    PDMP not reviewed this encounter.   Stuart Vernell Norris, [place] 01/26/24 1951

## 2024-01-26 NOTE — ED Triage Notes (Signed)
 Pt reports she has more frequent stool x 3 weeks. States every time she uses the bathroom to urinate small pieces of stool come out and some times it is in liquid form. Reports some slight lower abdominal cramping  Denies any recent travel. No diet chages. No new meds. Denies pain  Took pepto

## 2024-01-27 LAB — BASIC METABOLIC PANEL WITH GFR
BUN/Creatinine Ratio: 11 (ref 9–23)
BUN: 10 mg/dL (ref 6–20)
CO2: 21 mmol/L (ref 20–29)
Calcium: 9 mg/dL (ref 8.7–10.2)
Chloride: 109 mmol/L — ABNORMAL HIGH (ref 96–106)
Creatinine, Ser: 0.93 mg/dL (ref 0.57–1.00)
Glucose: 73 mg/dL (ref 70–99)
Potassium: 4.2 mmol/L (ref 3.5–5.2)
Sodium: 145 mmol/L — ABNORMAL HIGH (ref 134–144)
eGFR: 89 mL/min/1.73 (ref >59)

## 2024-01-27 LAB — LIPASE: Lipase: 16 U/L (ref 14–72)

## 2024-01-27 LAB — GASTROINTESTINAL PANEL BY PCR, STOOL (REPLACES STOOL CULTURE)

## 2024-01-27 LAB — CBC WITH DIFFERENTIAL/PLATELET
Basophils Absolute: 0 x10E3/uL (ref 0.0–0.2)
Basos: 1 %
EOS (ABSOLUTE): 0.1 x10E3/uL (ref 0.0–0.4)
Eos: 2 %
Hematocrit: 39.9 % (ref 34.0–46.6)
Hemoglobin: 12.7 g/dL (ref 11.1–15.9)
Immature Grans (Abs): 0 x10E3/uL (ref 0.0–0.1)
Immature Granulocytes: 0 %
Lymphocytes Absolute: 1.7 x10E3/uL (ref 0.7–3.1)
Lymphs: 42 %
MCH: 27.6 pg (ref 26.6–33.0)
MCHC: 31.8 g/dL (ref 31.5–35.7)
MCV: 87 fL (ref 79–97)
Monocytes Absolute: 0.3 x10E3/uL (ref 0.1–0.9)
Monocytes: 6 %
Neutrophils Absolute: 2 x10E3/uL (ref 1.4–7.0)
Neutrophils: 49 %
Platelets: 228 x10E3/uL (ref 150–450)
RBC: 4.6 x10E6/uL (ref 3.77–5.28)
RDW: 12.6 % (ref 11.7–15.4)
WBC: 4.1 x10E3/uL (ref 3.4–10.8)

## 2024-01-29 ENCOUNTER — Ambulatory Visit (HOSPITAL_COMMUNITY): Payer: Self-pay

## 2024-03-06 ENCOUNTER — Ambulatory Visit: Admitting: Internal Medicine

## 2024-03-06 ENCOUNTER — Encounter: Payer: Self-pay | Admitting: Internal Medicine

## 2024-03-06 VITALS — BP 121/78 | HR 89 | Temp 97.5°F | Ht 64.0 in | Wt 156.6 lb

## 2024-03-06 DIAGNOSIS — K529 Noninfective gastroenteritis and colitis, unspecified: Secondary | ICD-10-CM | POA: Diagnosis not present

## 2024-03-06 DIAGNOSIS — R109 Unspecified abdominal pain: Secondary | ICD-10-CM | POA: Diagnosis not present

## 2024-03-06 DIAGNOSIS — R195 Other fecal abnormalities: Secondary | ICD-10-CM | POA: Diagnosis not present

## 2024-03-06 DIAGNOSIS — R197 Diarrhea, unspecified: Secondary | ICD-10-CM

## 2024-03-06 NOTE — Progress Notes (Signed)
 Primary Care Physician:  Alphonsa Glendia LABOR, MD Primary Gastroenterologist:  Dr. Cindie  Chief Complaint  Patient presents with   New Patient (Initial Visit)    Patient here today due to having clear liquid stools. She say she feels she has to defecate, but produces nothing or she will have the mucus, or fluffy stools. Patient describes the stools to look like cauliflower.     HPI:   Toni Johnston is a 23 y.o. female who presents to the clinic today by referral from urgent care.  Patient was in her normal state of health until approximately 2 months ago when she had sudden onset of watery diarrhea.  Initially she had numerous bowel movements.  Now having 1-2 watery bowel movements a day.  Also will feel the urge to have a bowel movement and all that she will defecate is what she describes as mucus, frothy material.  Denies any rectal pain or discomfort.  No melena or hematochezia.  Stool studies performed at urgent care were negative for infectious pathogens.  Did not test for C. difficile.  Patient denies any recent exposure to antibiotics.  Blood work largely unremarkable including CBC, BMP, lipase.  No family history of colon cancer or inflammatory bowel disease.  Denies any frank abdominal pain but does note some abdominal bloating.  No prior colonoscopy.  Denies any chronic NSAID use.  History reviewed. No pertinent past medical history.  Past Surgical History:  Procedure Laterality Date   WISDOM TOOTH EXTRACTION      Current Outpatient Medications  Medication Sig Dispense Refill   amoxicillin  (AMOXIL ) 500 MG capsule Take 1 capsule (500 mg total) by mouth 2 (two) times daily. X 7 days 14 capsule 0   Drospirenone-Estetrol (NEXTSTELLIS ) 3-14.2 MG TABS Take 1 tablet by mouth daily. (Patient not taking: Reported on 03/28/2023) 90 tablet 3   fluconazole  (DIFLUCAN ) 150 MG tablet Take 1 now and 1 in 3 days (Patient not taking: Reported on 03/28/2023) 2 tablet 1    metroNIDAZOLE  (FLAGYL ) 500 MG tablet Take 1 tablet (500 mg total) by mouth 2 (two) times daily. 14 tablet 0   No current facility-administered medications for this visit.    Allergies as of 03/06/2024   (No Known Allergies)    Family History  Problem Relation Age of Onset   Cancer Paternal Grandfather    Hypertension Maternal Grandmother    Seizures Maternal Grandmother     Social History   Socioeconomic History   Marital status: Single    Spouse name: Not on file   Number of children: 0   Years of education: Not on file   Highest education level: Not on file  Occupational History   Not on file  Tobacco Use   Smoking status: Never   Smokeless tobacco: Never  Vaping Use   Vaping status: Never Used  Substance and Sexual Activity   Alcohol use: Yes    Comment: occasionally   Drug use: No   Sexual activity: Yes    Birth control/protection: None, Condom  Other Topics Concern   Not on file  Social History Narrative   Not on file   Social Drivers of Health   Financial Resource Strain: Not on file  Food Insecurity: Not on file  Transportation Needs: Not on file  Physical Activity: Not on file  Stress: Not on file  Social Connections: Not on file  Intimate Partner Violence: Not on file    Subjective: Review of Systems  Constitutional:  Negative for chills and fever.  HENT:  Negative for congestion and hearing loss.   Eyes:  Negative for blurred vision and double vision.  Respiratory:  Negative for cough and shortness of breath.   Cardiovascular:  Negative for chest pain and palpitations.  Gastrointestinal:  Positive for diarrhea. Negative for abdominal pain, blood in stool, constipation, heartburn, melena and vomiting.  Genitourinary:  Negative for dysuria and urgency.  Musculoskeletal:  Negative for joint pain and myalgias.  Skin:  Negative for itching and rash.  Neurological:  Negative for dizziness and headaches.  Psychiatric/Behavioral:  Negative for  depression. The patient is not nervous/anxious.        Objective: BP 121/78 (BP Location: Left Arm, Patient Position: Sitting, Cuff Size: Normal)   Pulse 89   Temp (!) 97.5 F (36.4 C) (Temporal)   Ht 5' 4 (1.626 m)   Wt 156 lb 9.6 oz (71 kg)   BMI 26.88 kg/m  Physical Exam Constitutional:      Appearance: Normal appearance.  HENT:     Head: Normocephalic and atraumatic.  Eyes:     Extraocular Movements: Extraocular movements intact.     Conjunctiva/sclera: Conjunctivae normal.  Cardiovascular:     Rate and Rhythm: Normal rate and regular rhythm.  Pulmonary:     Effort: Pulmonary effort is normal.     Breath sounds: Normal breath sounds.  Abdominal:     General: Bowel sounds are normal.     Palpations: Abdomen is soft.  Musculoskeletal:        General: No swelling. Normal range of motion.     Cervical back: Normal range of motion and neck supple.  Skin:    General: Skin is warm and dry.     Coloration: Skin is not jaundiced.  Neurological:     General: No focal deficit present.     Mental Status: She is alert and oriented to person, place, and time.  Psychiatric:        Mood and Affect: Mood normal.        Behavior: Behavior normal.      Assessment: *Diarrhea-chronic *Mucus in stool *Abdominal cramping  Plan: Discussed in depth with patient today.  Will check TSH, ESR, CRP, celiac testing.  Call with results.  Discussed role of colonoscopy given mucus in her stool and she would like to hold off for now.  Recommend she start taking Imodium 1 tablet daily titrate as needed.  Low FODMAP diet printed off for patient today.  Follow-up in 6 weeks, if not improved can consider colonoscopy at that time.  03/06/2024 3:42 PM

## 2024-03-06 NOTE — Patient Instructions (Signed)
 I am going to check blood work today at [facility] including your thyroid, celiac testing, as well as a few inflammatory markers.  We will call with results.  I want you to start taking Imodium 1 tablet daily.  If this causes constipation then would recommend decreasing to half tablet daily or 1 tablet every other day.  I am going to print you off a low FODMAP diet which may give you some insight on certain foods to avoid.  Follow-up in 6 weeks.  If not improved or your blood work is abnormal then we can schedule colonoscopy prior.  It was very nice meeting both you today.  Dr. Cindie

## 2024-03-07 ENCOUNTER — Telehealth: Payer: Self-pay

## 2024-03-07 NOTE — Telephone Encounter (Signed)
 Sent to Dr Cindie in secure chat:  pt Toni Johnston on vm stating you were suppose to send her in a Rx for her. All I seen was for her to Imodium. please advise

## 2024-03-09 LAB — CELIAC DISEASE PANEL
Endomysial IgA: NEGATIVE
IgA/Immunoglobulin A, Serum: 561 mg/dL — ABNORMAL HIGH (ref 87–352)
Transglutaminase IgA: <2 U/mL (ref 0–3)

## 2024-03-09 LAB — C-REACTIVE PROTEIN: CRP: <1 mg/L (ref 0–10)

## 2024-03-09 LAB — TSH: TSH: 1.18 u[IU]/mL (ref 0.450–4.500)

## 2024-03-09 LAB — SEDIMENTATION RATE: Sed Rate: 3 mm/h (ref 0–32)

## 2024-03-14 ENCOUNTER — Ambulatory Visit: Payer: Self-pay | Admitting: Internal Medicine

## 2024-03-14 NOTE — Telephone Encounter (Signed)
 Sent Dr Cindie a message regarding this. Waiting on a response

## 2024-03-19 NOTE — Telephone Encounter (Signed)
 Sent to Dr Cindie in secure chat

## 2024-03-20 NOTE — Telephone Encounter (Signed)
 Dr Cindie will address in the am (secure chat message).

## 2024-04-01 NOTE — Telephone Encounter (Signed)
 negative for celiac, normal thyroid, negative inflammatory markers. 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 We will recheck along with other immunoglobulin levels on fu visit. If still elevated may need further work up.

## 2024-04-01 NOTE — Telephone Encounter (Signed)
 Sent Dr Cindie another secure chat about this pt's question regarding medication suppose to be sent in for her. Waiting on a response.

## 2024-04-01 NOTE — Telephone Encounter (Signed)
 yes the medication I wanted her to start taking, she can bu at the pharmacy. Imodium  Now Blood work will be discussed further on next ov.

## 2024-04-02 NOTE — Telephone Encounter (Signed)
 Phoned and spoke with the pt regarding the medication she was asking about (Imodium). Pt states that once she left she finally figured it out. The pt was advised of her lab results. Pt expressed understanding

## 2024-04-03 ENCOUNTER — Ambulatory Visit (INDEPENDENT_AMBULATORY_CARE_PROVIDER_SITE_OTHER): Admitting: Women's Health

## 2024-04-03 ENCOUNTER — Encounter: Payer: Self-pay | Admitting: Women's Health

## 2024-04-03 ENCOUNTER — Other Ambulatory Visit (HOSPITAL_COMMUNITY)
Admission: RE | Admit: 2024-04-03 | Discharge: 2024-04-03 | Disposition: A | Discharge status: Home / Self Care | Source: Ambulatory Visit | Attending: Women's Health | Admitting: Women's Health

## 2024-04-03 VITALS — BP 110/74 | HR 94 | Ht 64.0 in | Wt 148.0 lb

## 2024-04-03 DIAGNOSIS — N926 Irregular menstruation, unspecified: Secondary | ICD-10-CM

## 2024-04-03 DIAGNOSIS — Z113 Encounter for screening for infections with a predominantly sexual mode of transmission: Secondary | ICD-10-CM | POA: Diagnosis present

## 2024-04-03 DIAGNOSIS — R102 Pelvic and perineal pain unspecified side: Secondary | ICD-10-CM

## 2024-04-03 DIAGNOSIS — R1032 Left lower quadrant pain: Secondary | ICD-10-CM

## 2024-04-03 NOTE — Progress Notes (Signed)
   GYN VISIT Patient name: Toni Johnston MRN [id number]  Date of birth: May 06, 2001 Chief Complaint:   cramping,LLQ pain, more frequent bleeding  History of Present Illness:   Toni Johnston is a 23 y.o. G0P0000 African-American female being seen today for report of intermittent LLQ stabbing pain, cramping, longer periods last ~54mths. Takes ibuprofen q am before work. Denies abnormal discharge, itching/odor/irritation.     Patient's last menstrual period was 03/16/2024. The current method of family planning is condoms.  Last pap 06/21/22. Results were: NILM w/ HRHPV not done     07/23/2018   10:40 AM  Depression screen PHQ 2/9  Decreased Interest 2  Down, Depressed, Hopeless 1  PHQ - 2 Score 3  Altered sleeping 3  Tired, decreased energy 3  Change in appetite 3  Feeling bad or failure about yourself  2  Trouble concentrating 1  Moving slowly or fidgety/restless 0  Suicidal thoughts 0  PHQ-9 Score 15  Difficult doing work/chores Not difficult at all        07/23/2018   10:38 AM  GAD 7 : Generalized Anxiety Score  Nervous, Anxious, on Edge 2  Control/stop worrying 1  Worry too much - different things 3  Trouble relaxing 1  Restless 1  Easily annoyed or irritable 3  Afraid - awful might happen 0  Total GAD 7 Score 11  Anxiety Difficulty Not difficult at all     Review of Systems:   Pertinent items are noted in HPI Denies fever/chills, dizziness, headaches, visual disturbances, fatigue, shortness of breath, chest pain, abdominal pain, vomiting, abnormal vaginal discharge/itching/odor/irritation, problems with periods, bowel movements, urination, or intercourse unless otherwise stated above.  Pertinent History Reviewed:  Reviewed past medical,surgical, social, obstetrical and family history.  Reviewed problem list, medications and allergies. Physical Assessment:   Vitals:   04/03/24 1419  BP: 110/74  Pulse: 94  Weight: 148 lb (67.1 kg)  Height: 5' 4 (1.626 m)   Body mass index is 25.4 kg/m.       Physical Examination:   General appearance: alert, well appearing, and in no distress  Mental status: alert, oriented to person, place, and time  Skin: warm & dry   Cardiovascular: normal heart rate noted  Respiratory: normal respiratory effort, no distress  Abdomen: soft, non-tender   Pelvic: VULVA: normal appearing vulva with no masses, tenderness or lesions, VAGINA: normal appearing vagina with normal color and discharge, no lesions, CERVIX: normal appearing cervix without discharge or lesions, UTERUS: uterus is normal size, shape, consistency and nontender, ADNEXA: normal adnexa in size, nontender and no masses  Extremities: no edema   Chaperone: Latisha Cresenzo  No results found for this or any previous visit (from the past 24 hours).  Assessment & Plan:  1) LLQ pain, cramping, longer periods x ~3mths> CV swab, if neg will get pelvic u/s  Meds: No orders of the defined types were placed in this encounter.   No orders of the defined types were placed in this encounter.   Return in about 1 year (around 04/03/2025) for Physical.  Suzen JONELLE Fetters CNM, [facility] 04/03/2024 2:33 PM

## 2024-04-05 LAB — CERVICOVAGINAL ANCILLARY ONLY
Bacterial Vaginitis (gardnerella): POSITIVE — AB
Candida Glabrata: NEGATIVE
Candida Vaginitis: POSITIVE — AB
Chlamydia: NEGATIVE
Comment: NEGATIVE
Comment: NEGATIVE
Comment: NEGATIVE
Comment: NEGATIVE
Comment: NEGATIVE
Comment: NORMAL
Neisseria Gonorrhea: NEGATIVE
Trichomonas: NEGATIVE

## 2024-04-08 ENCOUNTER — Ambulatory Visit: Payer: Self-pay | Admitting: Women's Health

## 2024-04-08 MED ORDER — METRONIDAZOLE 500 MG PO TABS: 500.0000 mg | ORAL_TABLET | Freq: Two times a day (BID) | ORAL | 0 refills | Status: DC

## 2024-04-08 MED ORDER — FLUCONAZOLE 150 MG PO TABS: 150.0000 mg | ORAL_TABLET | Freq: Once | ORAL | 0 refills | Status: AC

## 2024-04-24 ENCOUNTER — Encounter: Payer: Self-pay | Admitting: Internal Medicine

## 2024-04-24 ENCOUNTER — Ambulatory Visit: Admitting: Internal Medicine

## 2024-04-24 ENCOUNTER — Telehealth (INDEPENDENT_AMBULATORY_CARE_PROVIDER_SITE_OTHER): Payer: Self-pay

## 2024-04-24 VITALS — BP 105/66 | HR 81 | Temp 97.8°F | Ht 64.0 in | Wt 153.3 lb

## 2024-04-24 DIAGNOSIS — R195 Other fecal abnormalities: Secondary | ICD-10-CM

## 2024-04-24 DIAGNOSIS — R109 Unspecified abdominal pain: Secondary | ICD-10-CM | POA: Diagnosis not present

## 2024-04-24 DIAGNOSIS — R7689 Other specified abnormal immunological findings in serum: Secondary | ICD-10-CM

## 2024-04-24 DIAGNOSIS — R197 Diarrhea, unspecified: Secondary | ICD-10-CM

## 2024-04-24 MED ORDER — PEG 3350-KCL-NA BICARB-NACL 420 G PO SOLR: 4000.0000 mL | Freq: Once | ORAL | 0 refills | Status: AC

## 2024-04-24 NOTE — H&P (View-Only) (Signed)
 Primary Care Physician:  Alphonsa Glendia LABOR, MD Primary Gastroenterologist:  Dr. Cindie  Chief Complaint  Patient presents with   Follow-up    Patient here today for a follow up on issues with Diarrhea. Patient says she is still having issues with this and is using imodium prn. She has occasion cramping from diarrhea.     HPI:   Toni Johnston is a 23 y.o. female who presents to the clinic today for follow up visit.   Patient was in her normal state of health until approximately 3-4 months ago when she had sudden onset of watery diarrhea.  Initially she had numerous bowel movements.  Now having 1-2 watery bowel movements a day.  Also will feel the urge to have a bowel movement and all that she will defecate is what she describes as mucus, frothy material.  Also intermittent left lower quadrant abdominal pain.  Denies any rectal pain or discomfort.  No melena or hematochezia.  Stool studies performed at urgent care were negative for infectious pathogens.  Did not test for C. difficile.  Patient denies any recent exposure to antibiotics.  Blood work largely unremarkable including CBC, BMP, lipase.  No family history of colon cancer or inflammatory bowel disease.  Denies any frank abdominal pain but does note some abdominal bloating.  No prior colonoscopy.  Denies any chronic NSAID use.  Blood work negative for celiac, normal thyroid, normal ESR/CRP. Her IgA level was elevated.   She tried low FODMAP diet which seemed to help some. Symptoms returned after going back to her regular diet. Has taken imodium at times which also helps.    Past Medical History:  Diagnosis Date   Ovarian cyst     Past Surgical History:  Procedure Laterality Date   WISDOM TOOTH EXTRACTION      Current Outpatient Medications  Medication Sig Dispense Refill   ibuprofen (ADVIL) 800 MG tablet Take 800 mg by mouth every 8 (eight) hours as needed.     metroNIDAZOLE  (FLAGYL ) 500 MG tablet Take 1  tablet (500 mg total) by mouth 2 (two) times daily. 14 tablet 0   No current facility-administered medications for this visit.    Allergies as of 04/24/2024   (No Known Allergies)    Family History  Problem Relation Age of Onset   Cancer Paternal Grandfather    Aneurysm Paternal Grandfather    Hypertension Maternal Grandmother    Seizures Maternal Grandmother     Social History   Socioeconomic History   Marital status: Single    Spouse name: Not on file   Number of children: 0   Years of education: Not on file   Highest education level: Not on file  Occupational History   Not on file  Tobacco Use   Smoking status: Never   Smokeless tobacco: Never  Vaping Use   Vaping status: Never Used  Substance and Sexual Activity   Alcohol use: Yes    Comment: occasionally   Drug use: No   Sexual activity: Yes    Birth control/protection: None, Condom  Other Topics Concern   Not on file  Social History Narrative   Not on file   Social Drivers of Health   Financial Resource Strain: Not on file  Food Insecurity: Not on file  Transportation Needs: Not on file  Physical Activity: Not on file  Stress: Not on file  Social Connections: Not on file  Intimate Partner Violence: Not on file    Subjective:  Review of Systems  Constitutional:  Negative for chills and fever.  HENT:  Negative for congestion and hearing loss.   Eyes:  Negative for blurred vision and double vision.  Respiratory:  Negative for cough and shortness of breath.   Cardiovascular:  Negative for chest pain and palpitations.  Gastrointestinal:  Positive for diarrhea. Negative for abdominal pain, blood in stool, constipation, heartburn, melena and vomiting.  Genitourinary:  Negative for dysuria and urgency.  Musculoskeletal:  Negative for joint pain and myalgias.  Skin:  Negative for itching and rash.  Neurological:  Negative for dizziness and headaches.  Psychiatric/Behavioral:  Negative for depression. The  patient is not nervous/anxious.        Objective: BP 105/66 (BP Location: Left Arm, Patient Position: Sitting, Cuff Size: Normal)   Pulse 81   Temp 97.8 F (36.6 C) (Temporal)   Ht 5' 4 (1.626 m)   Wt 153 lb 4.8 oz (69.5 kg)   LMP 03/16/2024   BMI 26.31 kg/m  Physical Exam Constitutional:      Appearance: Normal appearance.  HENT:     Head: Normocephalic and atraumatic.  Eyes:     Extraocular Movements: Extraocular movements intact.     Conjunctiva/sclera: Conjunctivae normal.  Cardiovascular:     Rate and Rhythm: Normal rate and regular rhythm.  Pulmonary:     Effort: Pulmonary effort is normal.     Breath sounds: Normal breath sounds.  Abdominal:     General: Bowel sounds are normal.     Palpations: Abdomen is soft.  Musculoskeletal:        General: No swelling. Normal range of motion.     Cervical back: Normal range of motion and neck supple.  Skin:    General: Skin is warm and dry.     Coloration: Skin is not jaundiced.  Neurological:     General: No focal deficit present.     Mental Status: She is alert and oriented to person, place, and time.  Psychiatric:        Mood and Affect: Mood normal.        Behavior: Behavior normal.      Assessment: *Diarrhea-chronic *Mucus in stool *Abdominal cramping *Left lower quadrant abdominal pain  Plan: Discussed in depth with patient today.  TSH, ESR, CRP, celiac testing WNL.  Did have elevated IgA level.  Will recheck today as well as check other immunoglobulin levels.  Discussed role of colonoscopy given mucus in her stool and abdominal pain. She would like to proceed. Will schedule today.  The risks including infection, bleed, or perforation as well as benefits, limitations, alternatives and imponderables have been reviewed with the patient. Questions have been answered. All parties agreeable.  Continue Imodium as needed  Low FODMAP diet printed off for patient today.  Follow-up after  colonoscopy.  04/24/2024 3:10 PM

## 2024-04-24 NOTE — Telephone Encounter (Signed)
 Spoke with patient in the office, scheduled TCS for 05/07/2024 at 1:45pm. Rx sent to pharmacy, instructions sent to mychart.

## 2024-04-24 NOTE — Patient Instructions (Signed)
 I am going to check blood work at [facility] to recheck your immunoglobulin A level as well as check a few other immunoglobulins.  We will schedule you for colonoscopy to further evaluate your diarrhea and change in bowels.  It was very nice seeing you again today.  Dr. Cindie

## 2024-04-24 NOTE — Progress Notes (Signed)
 Primary Care Physician:  Alphonsa Glendia LABOR, MD Primary Gastroenterologist:  Dr. Cindie  Chief Complaint  Patient presents with   Follow-up    Patient here today for a follow up on issues with Diarrhea. Patient says she is still having issues with this and is using imodium prn. She has occasion cramping from diarrhea.     HPI:   Toni Johnston is a 23 y.o. female who presents to the clinic today for follow up visit.   Patient was in her normal state of health until approximately 3-4 months ago when she had sudden onset of watery diarrhea.  Initially she had numerous bowel movements.  Now having 1-2 watery bowel movements a day.  Also will feel the urge to have a bowel movement and all that she will defecate is what she describes as mucus, frothy material.  Also intermittent left lower quadrant abdominal pain.  Denies any rectal pain or discomfort.  No melena or hematochezia.  Stool studies performed at urgent care were negative for infectious pathogens.  Did not test for C. difficile.  Patient denies any recent exposure to antibiotics.  Blood work largely unremarkable including CBC, BMP, lipase.  No family history of colon cancer or inflammatory bowel disease.  Denies any frank abdominal pain but does note some abdominal bloating.  No prior colonoscopy.  Denies any chronic NSAID use.  Blood work negative for celiac, normal thyroid, normal ESR/CRP. Her IgA level was elevated.   She tried low FODMAP diet which seemed to help some. Symptoms returned after going back to her regular diet. Has taken imodium at times which also helps.    Past Medical History:  Diagnosis Date   Ovarian cyst     Past Surgical History:  Procedure Laterality Date   WISDOM TOOTH EXTRACTION      Current Outpatient Medications  Medication Sig Dispense Refill   ibuprofen (ADVIL) 800 MG tablet Take 800 mg by mouth every 8 (eight) hours as needed.     metroNIDAZOLE  (FLAGYL ) 500 MG tablet Take 1  tablet (500 mg total) by mouth 2 (two) times daily. 14 tablet 0   No current facility-administered medications for this visit.    Allergies as of 04/24/2024   (No Known Allergies)    Family History  Problem Relation Age of Onset   Cancer Paternal Grandfather    Aneurysm Paternal Grandfather    Hypertension Maternal Grandmother    Seizures Maternal Grandmother     Social History   Socioeconomic History   Marital status: Single    Spouse name: Not on file   Number of children: 0   Years of education: Not on file   Highest education level: Not on file  Occupational History   Not on file  Tobacco Use   Smoking status: Never   Smokeless tobacco: Never  Vaping Use   Vaping status: Never Used  Substance and Sexual Activity   Alcohol use: Yes    Comment: occasionally   Drug use: No   Sexual activity: Yes    Birth control/protection: None, Condom  Other Topics Concern   Not on file  Social History Narrative   Not on file   Social Drivers of Health   Financial Resource Strain: Not on file  Food Insecurity: Not on file  Transportation Needs: Not on file  Physical Activity: Not on file  Stress: Not on file  Social Connections: Not on file  Intimate Partner Violence: Not on file    Subjective:  Review of Systems  Constitutional:  Negative for chills and fever.  HENT:  Negative for congestion and hearing loss.   Eyes:  Negative for blurred vision and double vision.  Respiratory:  Negative for cough and shortness of breath.   Cardiovascular:  Negative for chest pain and palpitations.  Gastrointestinal:  Positive for diarrhea. Negative for abdominal pain, blood in stool, constipation, heartburn, melena and vomiting.  Genitourinary:  Negative for dysuria and urgency.  Musculoskeletal:  Negative for joint pain and myalgias.  Skin:  Negative for itching and rash.  Neurological:  Negative for dizziness and headaches.  Psychiatric/Behavioral:  Negative for depression. The  patient is not nervous/anxious.        Objective: BP 105/66 (BP Location: Left Arm, Patient Position: Sitting, Cuff Size: Normal)   Pulse 81   Temp 97.8 F (36.6 C) (Temporal)   Ht 5' 4 (1.626 m)   Wt 153 lb 4.8 oz (69.5 kg)   LMP 03/16/2024   BMI 26.31 kg/m  Physical Exam Constitutional:      Appearance: Normal appearance.  HENT:     Head: Normocephalic and atraumatic.  Eyes:     Extraocular Movements: Extraocular movements intact.     Conjunctiva/sclera: Conjunctivae normal.  Cardiovascular:     Rate and Rhythm: Normal rate and regular rhythm.  Pulmonary:     Effort: Pulmonary effort is normal.     Breath sounds: Normal breath sounds.  Abdominal:     General: Bowel sounds are normal.     Palpations: Abdomen is soft.  Musculoskeletal:        General: No swelling. Normal range of motion.     Cervical back: Normal range of motion and neck supple.  Skin:    General: Skin is warm and dry.     Coloration: Skin is not jaundiced.  Neurological:     General: No focal deficit present.     Mental Status: She is alert and oriented to person, place, and time.  Psychiatric:        Mood and Affect: Mood normal.        Behavior: Behavior normal.      Assessment: *Diarrhea-chronic *Mucus in stool *Abdominal cramping *Left lower quadrant abdominal pain  Plan: Discussed in depth with patient today.  TSH, ESR, CRP, celiac testing WNL.  Did have elevated IgA level.  Will recheck today as well as check other immunoglobulin levels.  Discussed role of colonoscopy given mucus in her stool and abdominal pain. She would like to proceed. Will schedule today.  The risks including infection, bleed, or perforation as well as benefits, limitations, alternatives and imponderables have been reviewed with the patient. Questions have been answered. All parties agreeable.  Continue Imodium as needed  Low FODMAP diet printed off for patient today.  Follow-up after  colonoscopy.  04/24/2024 3:10 PM

## 2024-04-24 NOTE — Telephone Encounter (Signed)
 PA for TCS on [employer]: This member's plan does not currently require notification or prior-authorization through the [employer] Notification or Prior-Authorization Program.

## 2024-04-27 LAB — IMMUNOGLOBULINS A/E/G/M, SERUM
IgE (Immunoglobulin E), Serum: 78 [IU]/mL (ref 6–495)
IgG (Immunoglobin G), Serum: 1624 mg/dL — ABNORMAL HIGH (ref 586–1602)
IgM (Immunoglobulin M), Srm: 89 mg/dL (ref 26–217)
Immunoglobulin A, (IgA) QN, Serum: 512 mg/dL — ABNORMAL HIGH (ref 87–352)

## 2024-05-01 ENCOUNTER — Other Ambulatory Visit (HOSPITAL_COMMUNITY): Admission: RE | Admit: 2024-05-01 | Source: Ambulatory Visit

## 2024-05-07 ENCOUNTER — Encounter (HOSPITAL_COMMUNITY)
Admission: RE | Disposition: A | Discharge status: Home / Self Care | Payer: Self-pay | Source: Home / Self Care | Attending: Internal Medicine

## 2024-05-07 ENCOUNTER — Other Ambulatory Visit: Payer: Self-pay

## 2024-05-07 ENCOUNTER — Encounter (HOSPITAL_COMMUNITY): Payer: Self-pay | Admitting: Internal Medicine

## 2024-05-07 ENCOUNTER — Ambulatory Visit (HOSPITAL_COMMUNITY): Admitting: Certified Registered"

## 2024-05-07 ENCOUNTER — Ambulatory Visit (HOSPITAL_COMMUNITY)
Admission: RE | Admit: 2024-05-07 | Discharge: 2024-05-07 | Disposition: A | Attending: Internal Medicine | Admitting: Internal Medicine

## 2024-05-07 DIAGNOSIS — K529 Noninfective gastroenteritis and colitis, unspecified: Secondary | ICD-10-CM

## 2024-05-07 DIAGNOSIS — K6289 Other specified diseases of anus and rectum: Secondary | ICD-10-CM | POA: Diagnosis not present

## 2024-05-07 HISTORY — PX: COLONOSCOPY: SHX5424

## 2024-05-07 LAB — POCT PREGNANCY, URINE: Preg Test, Ur: NEGATIVE

## 2024-05-07 SURGERY — COLONOSCOPY
Anesthesia: General

## 2024-05-07 MED ORDER — PROPOFOL 10 MG/ML IV BOLUS
INTRAVENOUS | Status: DC | PRN
  Administered 2024-05-07: 100 mg via INTRAVENOUS
  Administered 2024-05-07: 175 ug/kg/min via INTRAVENOUS

## 2024-05-07 MED ORDER — LIDOCAINE HCL (CARDIAC) PF 100 MG/5ML IV SOSY
PREFILLED_SYRINGE | INTRAVENOUS | Status: DC | PRN
  Administered 2024-05-07: 50 mg via INTRAVENOUS

## 2024-05-07 MED ORDER — LACTATED RINGERS IV SOLN: INTRAVENOUS | Status: DC | PRN

## 2024-05-07 MED ORDER — LACTATED RINGERS IV SOLN: INTRAVENOUS | Status: DC

## 2024-05-07 NOTE — Discharge Instructions (Addendum)
  Colonoscopy Discharge Instructions  Read the instructions outlined below and refer to this sheet in the next few weeks. These discharge instructions provide you with general information on caring for yourself after you leave the hospital. Your doctor may also give you specific instructions. While your treatment has been planned according to the most current medical practices available, unavoidable complications occasionally occur.   ACTIVITY You may resume your regular activity, but move at a slower pace for the next 24 hours.  Take frequent rest periods for the next 24 hours.  Walking will help get rid of the air and reduce the bloated feeling in your belly (abdomen).  No driving for 24 hours (because of the medicine (anesthesia) used during the test).   Do not sign any important legal documents or operate any machinery for 24 hours (because of the anesthesia used during the test).  NUTRITION Drink plenty of fluids.  You may resume your normal diet as instructed by your doctor.  Begin with a light meal and progress to your normal diet. Heavy or fried foods are harder to digest and may make you feel sick to your stomach (nauseated).  Avoid alcoholic beverages for 24 hours or as instructed.  MEDICATIONS You may resume your normal medications unless your doctor tells you otherwise.  WHAT YOU CAN EXPECT TODAY Some feelings of bloating in the abdomen.  Passage of more gas than usual.  Spotting of blood in your stool or on the toilet paper.  IF YOU HAD POLYPS REMOVED DURING THE COLONOSCOPY: No aspirin products for 7 days or as instructed.  No alcohol for 7 days or as instructed.  Eat a soft diet for the next 24 hours.  FINDING OUT THE RESULTS OF YOUR TEST Not all test results are available during your visit. If your test results are not back during the visit, make an appointment with your caregiver to find out the results. Do not assume everything is normal if you have not heard from your  caregiver or the medical facility. It is important for you to follow up on all of your test results.  SEEK IMMEDIATE MEDICAL ATTENTION IF: You have more than a spotting of blood in your stool.  Your belly is swollen (abdominal distention).  You are nauseated or vomiting.  You have a temperature over 101.  You have abdominal pain or discomfort that is severe or gets worse throughout the day.   Your colonoscopy revealed possible, mild inflammation on the left side your colon.  I took samples today.  The remaining portion your colon and end portion of your small bowel were normal.  I did not find any polyps or evidence of colon cancer.  Await pathology results, my office will contact you.  If pathology results normal, I will order further blood work in regards to your elevated IgA and IgG levels.  Follow-up in GI office in 6 weeks. Message sent to office.   I hope you have a great rest of your week!  Carlin POUR. Cindie, D.O. Gastroenterology and Hepatology [facility]

## 2024-05-07 NOTE — Op Note (Signed)
 [facility] Patient Name: Toni Johnston Procedure Date: 05/07/2024 11:17 AM MRN: [id number] Date of Birth: 11/29/2000 Attending MD: Carlin POUR. Cindie , [place], [id number] CSN: [id number] Age: 23 Admit Type: Outpatient Procedure:                Colonoscopy Indications:              Chronic diarrhea Providers:                Carlin POUR. Cindie, DO, Harlene Lips, Daphne Mulch Technician, Technician Referring MD:              Medicines:                See the Anesthesia note for documentation of the                            administered medications Complications:            No immediate complications. Estimated Blood Loss:     Estimated blood loss was minimal. Procedure:                Pre-Anesthesia Assessment:                           - The anesthesia plan was to use monitored                            anesthesia care (MAC).                           After obtaining informed consent, the colonoscope                            was passed under direct vision. Throughout the                            procedure, the patient's blood pressure, pulse, and                            oxygen saturations were monitored continuously. The                            [id number]-HQ190L ([id number]) Peds Colon was introduced                            through the anus and advanced to the the terminal                            ileum, with identification of the appendiceal                            orifice and IC valve. The colonoscopy was performed                            without difficulty. The patient tolerated the  procedure well. The quality of the bowel                            preparation was evaluated using the BBPS [facility]                            Bowel Preparation Scale) with scores of: Right                            Colon = 3, Transverse Colon = 3 and Left Colon = 3                            (entire mucosa seen well with no  residual staining,                            small fragments of stool or opaque liquid). The                            total BBPS score equals 9. Scope In: 11:26:33 AM Scope Out: 11:38:29 AM Scope Withdrawal Time: 0 hours 8 minutes 27 seconds  Total Procedure Duration: 0 hours 11 minutes 56 seconds  Findings:      The terminal ileum appeared normal.      ?Patchy mild inflammation characterized by erosions and erythema was       found in the rectum and in the sigmoid colon extending to approx 30 cm       from anal verge. Biopsies were taken with a cold forceps for histology. Impression:               - The examined portion of the ileum was normal.                           - Patchy mild inflammation was found in the rectum                            and in the sigmoid colon secondary to colitis.                            Biopsied. Moderate Sedation:      Per Anesthesia Care Recommendation:           - Patient has a contact number available for                            emergencies. The signs and symptoms of potential                            delayed complications were discussed with the                            patient. Return to normal activities tomorrow.                            Written discharge instructions were provided to the  patient.                           - Resume previous diet.                           - Continue present medications.                           - Await pathology results.                           - Repeat colonoscopy for surveillance based on                            pathology results.                           - Return to GI clinic in 6 weeks. Procedure Code(s):        --- Professional ---                           [id number], Colonoscopy, flexible; with biopsy, single                            or multiple Diagnosis Code(s):        --- Professional ---                           K52.9, Noninfective gastroenteritis and  colitis,                            unspecified CPT copyright 2022 American Medical Association. All rights reserved. The codes documented in this report are preliminary and upon coder review may  be revised to meet current compliance requirements. Carlin POUR. Cindie, DO Carlin POUR. Cindie, DO 05/07/2024 12:09:23 PM This report has been signed electronically. Number of Addenda: 0

## 2024-05-07 NOTE — Anesthesia Preprocedure Evaluation (Addendum)
 Anesthesia Evaluation  Patient identified by MRN, date of birth, ID band Patient awake    Reviewed: Allergy & Precautions, H&P , NPO status , Patient's Chart, lab work & pertinent test results  Airway Mallampati: I  TM Distance: >3 FB Neck ROM: Full    Dental no notable dental hx.    Pulmonary neg pulmonary ROS   Pulmonary exam normal breath sounds clear to auscultation       Cardiovascular negative cardio ROS Normal cardiovascular exam Rhythm:Regular Rate:Normal     Neuro/Psych negative neurological ROS  negative psych ROS   GI/Hepatic negative GI ROS, Neg liver ROS,,,  Endo/Other  negative endocrine ROS    Renal/GU negative Renal ROS  negative genitourinary   Musculoskeletal negative musculoskeletal ROS (+)    Abdominal   Peds negative pediatric ROS (+)  Hematology negative hematology ROS (+)   Anesthesia Other Findings   Reproductive/Obstetrics negative OB ROS                              Anesthesia Physical Anesthesia Plan  ASA: 1  Anesthesia Plan: General   Post-op Pain Management:    Induction: Intravenous  PONV Risk Score and Plan:   Airway Management Planned: Nasal Cannula  Additional Equipment:   Intra-op Plan:   Post-operative Plan:   Informed Consent: I have reviewed the patients History and Physical, chart, labs and discussed the procedure including the risks, benefits and alternatives for the proposed anesthesia with the patient or authorized representative who has indicated his/her understanding and acceptance.     Dental advisory given  Plan Discussed with: CRNA  Anesthesia Plan Comments:         Anesthesia Quick Evaluation

## 2024-05-07 NOTE — Interval H&P Note (Signed)
 History and Physical Interval Note:  05/07/2024 11:17 AM  Toni Johnston  has presented today for surgery, with the diagnosis of diarrhea, change in bowel habits.  The various methods of treatment have been discussed with the patient and family. After consideration of risks, benefits and other options for treatment, the patient has consented to  Procedure(s) with comments: COLONOSCOPY (N/A) - 1:45pm, ASA 1, pt knows to arrive at 10:00 as a surgical intervention.  The patient's history has been reviewed, patient examined, no change in status, stable for surgery.  I have reviewed the patient's chart and labs.  Questions were answered to the patient's satisfaction.     [name]

## 2024-05-07 NOTE — Anesthesia Postprocedure Evaluation (Signed)
 Anesthesia Post Note  Patient: Toni Johnston  Procedure(s) Performed: COLONOSCOPY  Patient location during evaluation: PACU Anesthesia Type: General Level of consciousness: awake and alert Pain management: pain level controlled Vital Signs Assessment: post-procedure vital signs reviewed and stable Respiratory status: spontaneous breathing, nonlabored ventilation, respiratory function stable and patient connected to nasal cannula oxygen Cardiovascular status: blood pressure returned to baseline and stable Postop Assessment: no apparent nausea or vomiting Anesthetic complications: no   No notable events documented.   Last Vitals:  Vitals:   05/07/24 1013 05/07/24 1141  BP: 117/71 (!) 104/50  Pulse: 76 78  Resp: (!) 21 (!) 21  Temp: 36.6 C 36.6 C  SpO2: 100% 100%    Last Pain:  Vitals:   05/07/24 1141  TempSrc: Oral  PainSc: 0-No pain                 Andrea Limes

## 2024-05-07 NOTE — Transfer of Care (Signed)
 Immediate Anesthesia Transfer of Care Note  Patient: Toni Johnston  Procedure(s) Performed: COLONOSCOPY  Patient Location: Endoscopy Unit  Anesthesia Type:General  Level of Consciousness: awake, alert , oriented, and patient cooperative  Airway & Oxygen Therapy: Patient Spontanous Breathing  Post-op Assessment: Report given to RN and Post -op Vital signs reviewed and stable  Post vital signs: Reviewed and stable  Last Vitals:  Vitals Value Taken Time  BP 104/50 05/07/24 11:41  Temp 36.6 C 05/07/24 11:41  Pulse 78 05/07/24 11:41  Resp 21 05/07/24 11:41  SpO2 100 % 05/07/24 11:41    Last Pain:  Vitals:   05/07/24 1141  TempSrc: Oral  PainSc: 0-No pain      Patients Stated Pain Goal: 7 (05/07/24 1013)  Complications: No notable events documented.

## 2024-05-08 ENCOUNTER — Encounter (HOSPITAL_COMMUNITY): Payer: Self-pay | Admitting: Internal Medicine

## 2024-05-08 LAB — SURGICAL PATHOLOGY

## 2024-05-16 ENCOUNTER — Telehealth: Payer: Self-pay | Admitting: Internal Medicine

## 2024-05-16 NOTE — Telephone Encounter (Signed)
 Left a message that Dr. Tonie Madison. [facility] was now open and to try and scheudle her 6 week pp fu appt.

## 2024-07-15 ENCOUNTER — Ambulatory Visit: Admitting: Gastroenterology
